# Patient Record
Sex: Female | Born: 1960
Health system: Southern US, Community
[De-identification: ages and names within clinical notes are randomized; demographics above are authoritative.]

## PROBLEM LIST (undated history)

## (undated) DIAGNOSIS — E663 Overweight: Secondary | ICD-10-CM

## (undated) DIAGNOSIS — E785 Hyperlipidemia, unspecified: Secondary | ICD-10-CM

## (undated) DIAGNOSIS — I10 Essential (primary) hypertension: Secondary | ICD-10-CM

## (undated) DIAGNOSIS — E6609 Other obesity due to excess calories: Secondary | ICD-10-CM

## (undated) DIAGNOSIS — R7303 Prediabetes: Secondary | ICD-10-CM

## (undated) DIAGNOSIS — E119 Type 2 diabetes mellitus without complications: Secondary | ICD-10-CM

## (undated) HISTORY — DX: Type 2 diabetes mellitus without complications: E11.9

## (undated) HISTORY — DX: Overweight: E66.3

## (undated) HISTORY — DX: Hyperlipidemia, unspecified: E78.5

## (undated) HISTORY — DX: Other obesity due to excess calories: E66.09

## (undated) HISTORY — DX: Essential (primary) hypertension: I10

## (undated) HISTORY — DX: Prediabetes: R73.03

## (undated) HISTORY — PX: ABDOMINAL HYSTERECTOMY: SHX81

---

## 2008-06-21 ENCOUNTER — Ambulatory Visit (HOSPITAL_COMMUNITY): Admission: RE | Admit: 2008-06-21 | Discharge: 2008-06-21 | Payer: Self-pay | Admitting: Obstetrics and Gynecology

## 2009-06-26 ENCOUNTER — Ambulatory Visit (HOSPITAL_COMMUNITY): Admission: RE | Admit: 2009-06-26 | Discharge: 2009-06-26 | Payer: Self-pay | Admitting: Obstetrics and Gynecology

## 2010-05-27 ENCOUNTER — Other Ambulatory Visit (HOSPITAL_COMMUNITY): Payer: Self-pay | Admitting: Obstetrics and Gynecology

## 2010-05-27 DIAGNOSIS — Z1239 Encounter for other screening for malignant neoplasm of breast: Secondary | ICD-10-CM

## 2010-05-27 DIAGNOSIS — Z1231 Encounter for screening mammogram for malignant neoplasm of breast: Secondary | ICD-10-CM

## 2010-07-03 ENCOUNTER — Ambulatory Visit (HOSPITAL_COMMUNITY): Payer: Self-pay

## 2010-07-10 ENCOUNTER — Ambulatory Visit (HOSPITAL_COMMUNITY)
Admission: RE | Admit: 2010-07-10 | Discharge: 2010-07-10 | Disposition: A | Discharge status: Home / Self Care | Payer: 59 | Source: Ambulatory Visit | Attending: Obstetrics and Gynecology | Admitting: Obstetrics and Gynecology

## 2010-07-10 DIAGNOSIS — Z1231 Encounter for screening mammogram for malignant neoplasm of breast: Secondary | ICD-10-CM | POA: Insufficient documentation

## 2011-08-20 ENCOUNTER — Other Ambulatory Visit (HOSPITAL_COMMUNITY): Payer: Self-pay | Admitting: Obstetrics and Gynecology

## 2011-08-20 DIAGNOSIS — Z1231 Encounter for screening mammogram for malignant neoplasm of breast: Secondary | ICD-10-CM

## 2011-09-19 ENCOUNTER — Ambulatory Visit (HOSPITAL_COMMUNITY)
Admission: RE | Admit: 2011-09-19 | Discharge: 2011-09-19 | Disposition: A | Discharge status: Home / Self Care | Payer: 59 | Source: Ambulatory Visit | Attending: Obstetrics and Gynecology | Admitting: Obstetrics and Gynecology

## 2011-09-19 DIAGNOSIS — Z1231 Encounter for screening mammogram for malignant neoplasm of breast: Secondary | ICD-10-CM | POA: Insufficient documentation

## 2012-08-19 ENCOUNTER — Other Ambulatory Visit (HOSPITAL_COMMUNITY): Payer: Self-pay | Admitting: Obstetrics and Gynecology

## 2012-08-19 DIAGNOSIS — Z1231 Encounter for screening mammogram for malignant neoplasm of breast: Secondary | ICD-10-CM

## 2012-09-16 ENCOUNTER — Other Ambulatory Visit: Payer: Self-pay | Admitting: Nurse Practitioner

## 2012-09-16 DIAGNOSIS — R1013 Epigastric pain: Secondary | ICD-10-CM

## 2012-09-21 ENCOUNTER — Other Ambulatory Visit: Payer: Self-pay | Admitting: Obstetrics and Gynecology

## 2012-09-21 ENCOUNTER — Ambulatory Visit (HOSPITAL_COMMUNITY)
Admission: RE | Admit: 2012-09-21 | Discharge: 2012-09-21 | Disposition: A | Discharge status: Home / Self Care | Payer: 59 | Source: Ambulatory Visit | Attending: Obstetrics and Gynecology | Admitting: Obstetrics and Gynecology

## 2012-09-21 DIAGNOSIS — R928 Other abnormal and inconclusive findings on diagnostic imaging of breast: Secondary | ICD-10-CM

## 2012-09-21 DIAGNOSIS — Z1231 Encounter for screening mammogram for malignant neoplasm of breast: Secondary | ICD-10-CM | POA: Insufficient documentation

## 2012-09-29 ENCOUNTER — Other Ambulatory Visit: Payer: 59

## 2012-10-01 ENCOUNTER — Ambulatory Visit
Admission: RE | Admit: 2012-10-01 | Discharge: 2012-10-01 | Disposition: A | Discharge status: Home / Self Care | Payer: 59 | Source: Ambulatory Visit | Attending: Obstetrics and Gynecology | Admitting: Obstetrics and Gynecology

## 2012-10-01 DIAGNOSIS — R928 Other abnormal and inconclusive findings on diagnostic imaging of breast: Secondary | ICD-10-CM

## 2013-09-13 ENCOUNTER — Other Ambulatory Visit (HOSPITAL_COMMUNITY): Payer: Self-pay | Admitting: Obstetrics and Gynecology

## 2013-09-13 DIAGNOSIS — Z1231 Encounter for screening mammogram for malignant neoplasm of breast: Secondary | ICD-10-CM

## 2013-09-26 ENCOUNTER — Ambulatory Visit (HOSPITAL_COMMUNITY)
Admission: RE | Admit: 2013-09-26 | Discharge: 2013-09-26 | Disposition: A | Discharge status: Home / Self Care | Payer: 59 | Source: Ambulatory Visit | Attending: Obstetrics and Gynecology | Admitting: Obstetrics and Gynecology

## 2013-09-26 DIAGNOSIS — Z1231 Encounter for screening mammogram for malignant neoplasm of breast: Secondary | ICD-10-CM | POA: Insufficient documentation

## 2014-09-08 ENCOUNTER — Other Ambulatory Visit (HOSPITAL_COMMUNITY): Payer: Self-pay | Admitting: Obstetrics and Gynecology

## 2014-09-08 DIAGNOSIS — Z1231 Encounter for screening mammogram for malignant neoplasm of breast: Secondary | ICD-10-CM

## 2014-09-28 ENCOUNTER — Ambulatory Visit (HOSPITAL_COMMUNITY)
Admission: RE | Admit: 2014-09-28 | Discharge: 2014-09-28 | Disposition: A | Discharge status: Home / Self Care | Payer: 59 | Source: Ambulatory Visit | Attending: Obstetrics and Gynecology | Admitting: Obstetrics and Gynecology

## 2014-09-28 ENCOUNTER — Ambulatory Visit (HOSPITAL_COMMUNITY): Payer: 59

## 2014-09-28 DIAGNOSIS — Z1231 Encounter for screening mammogram for malignant neoplasm of breast: Secondary | ICD-10-CM | POA: Insufficient documentation

## 2015-06-13 DIAGNOSIS — H524 Presbyopia: Secondary | ICD-10-CM | POA: Diagnosis not present

## 2015-06-13 DIAGNOSIS — H5213 Myopia, bilateral: Secondary | ICD-10-CM | POA: Diagnosis not present

## 2015-09-06 ENCOUNTER — Other Ambulatory Visit: Payer: Self-pay | Admitting: Obstetrics and Gynecology

## 2015-09-06 ENCOUNTER — Other Ambulatory Visit: Payer: Self-pay

## 2015-09-06 DIAGNOSIS — Z1231 Encounter for screening mammogram for malignant neoplasm of breast: Secondary | ICD-10-CM

## 2015-10-01 ENCOUNTER — Ambulatory Visit
Admission: RE | Admit: 2015-10-01 | Discharge: 2015-10-01 | Disposition: A | Discharge status: Home / Self Care | Payer: 59 | Source: Ambulatory Visit | Attending: Obstetrics and Gynecology | Admitting: Obstetrics and Gynecology

## 2015-10-01 DIAGNOSIS — Z1231 Encounter for screening mammogram for malignant neoplasm of breast: Secondary | ICD-10-CM | POA: Diagnosis not present

## 2016-02-01 DIAGNOSIS — Z01419 Encounter for gynecological examination (general) (routine) without abnormal findings: Secondary | ICD-10-CM | POA: Diagnosis not present

## 2016-02-01 DIAGNOSIS — Z6828 Body mass index (BMI) 28.0-28.9, adult: Secondary | ICD-10-CM | POA: Diagnosis not present

## 2016-02-01 DIAGNOSIS — Z1329 Encounter for screening for other suspected endocrine disorder: Secondary | ICD-10-CM | POA: Diagnosis not present

## 2016-02-01 DIAGNOSIS — Z130 Encounter for screening for diseases of the blood and blood-forming organs and certain disorders involving the immune mechanism: Secondary | ICD-10-CM | POA: Diagnosis not present

## 2016-02-01 DIAGNOSIS — Z1321 Encounter for screening for nutritional disorder: Secondary | ICD-10-CM | POA: Diagnosis not present

## 2016-02-01 DIAGNOSIS — Z0000 Encounter for general adult medical examination without abnormal findings: Secondary | ICD-10-CM | POA: Diagnosis not present

## 2016-02-01 DIAGNOSIS — Z13220 Encounter for screening for lipoid disorders: Secondary | ICD-10-CM | POA: Diagnosis not present

## 2016-02-01 DIAGNOSIS — Z131 Encounter for screening for diabetes mellitus: Secondary | ICD-10-CM | POA: Diagnosis not present

## 2016-07-17 DIAGNOSIS — H5213 Myopia, bilateral: Secondary | ICD-10-CM | POA: Diagnosis not present

## 2016-08-20 ENCOUNTER — Ambulatory Visit (HOSPITAL_COMMUNITY)
Admission: EM | Admit: 2016-08-20 | Discharge: 2016-08-20 | Disposition: A | Discharge status: Home / Self Care | Payer: 59 | Attending: Family Medicine | Admitting: Family Medicine

## 2016-08-20 ENCOUNTER — Encounter (HOSPITAL_COMMUNITY): Payer: Self-pay | Admitting: Family Medicine

## 2016-08-20 DIAGNOSIS — S39012A Strain of muscle, fascia and tendon of lower back, initial encounter: Secondary | ICD-10-CM

## 2016-08-20 MED ORDER — CYCLOBENZAPRINE HCL 10 MG PO TABS: 10.0000 mg | ORAL_TABLET | Freq: Every day | ORAL | 0 refills | Status: DC

## 2016-08-20 MED ORDER — NAPROXEN 500 MG PO TABS: 500.0000 mg | ORAL_TABLET | Freq: Two times a day (BID) | ORAL | 0 refills | Status: DC

## 2016-08-20 NOTE — ED Provider Notes (Signed)
CSN: [id number]     Arrival date & time 08/20/16  1108 History   None    Chief Complaint  Patient presents with  . Back Pain   (Consider location/radiation/quality/duration/timing/severity/associated sxs/prior Treatment) Patient c/o left lumbar muscle tenderness and back pain   The history is provided by the patient.  Back Pain  Location:  Lumbar spine Quality:  Aching Pain severity:  Moderate Pain is:  Worse during the day Onset quality:  Sudden Duration:  1 day Timing:  Constant Chronicity:  New Relieved by:  Nothing Worsened by:  Nothing Ineffective treatments:  None tried   History reviewed. No pertinent past medical history. Past Surgical History:  Procedure Laterality Date  . ABDOMINAL HYSTERECTOMY     History reviewed. No pertinent family history. Social History  Substance Use Topics  . Smoking status: Never Smoker  . Smokeless tobacco: Never Used  . Alcohol use Not on file   OB History    No data available     Review of Systems  Constitutional: Negative.   HENT: Negative.   Eyes: Negative.   Respiratory: Negative.   Cardiovascular: Negative.   Gastrointestinal: Negative.   Endocrine: Negative.   Genitourinary: Negative.   Musculoskeletal: Positive for back pain.  Allergic/Immunologic: Negative.   Neurological: Negative.   Hematological: Negative.   Psychiatric/Behavioral: Negative.     Allergies  Patient has no known allergies.  Home Medications   Prior to Admission medications   Medication Sig Start Date End Date Taking? Authorizing Provider  cyclobenzaprine (FLEXERIL) 10 MG tablet Take 1 tablet (10 mg total) by mouth at bedtime. 08/20/16   Lysbeth Penner, FNP  naproxen (NAPROSYN) 500 MG tablet Take 1 tablet (500 mg total) by mouth 2 (two) times daily with a meal. 08/20/16   Sora Vrooman, Orson Ape, FNP   Meds Ordered and Administered this Visit  Medications - No data to display  BP (!) 150/97   Pulse 77   Resp 18   SpO2 100%  No data  found.   Physical Exam  Constitutional: She is oriented to person, place, and time. She appears well-developed and well-nourished.  HENT:  Head: Normocephalic and atraumatic.  Eyes: Conjunctivae and EOM are normal. Pupils are equal, round, and reactive to light.  Cardiovascular: Normal rate and normal heart sounds.   Pulmonary/Chest: Effort normal and breath sounds normal.  Abdominal: Soft. Bowel sounds are normal.  Musculoskeletal: She exhibits tenderness.  TTP left lumbar spine paraspinous muscles  Neurological: She is alert and oriented to person, place, and time.  Nursing note and vitals reviewed.   Urgent Care Course     Procedures (including critical care time)  Labs Review Labs Reviewed - No data to display  Imaging Review No results found.   Visual Acuity Review  Right Eye Distance:   Left Eye Distance:   Bilateral Distance:    Right Eye Near:   Left Eye Near:    Bilateral Near:         MDM   1. Strain of lumbar region, initial encounter    Naprosyn 500mg  one po bid x 10 days #20 Cyclobenzaprine 10mg  one po qhs prn #10      Lysbeth Penner, FNP 08/20/16 1225

## 2016-08-20 NOTE — ED Triage Notes (Signed)
Pt here for left sided lower back pain x 1 week. sts the pain is constant. sts worse with sitting. Denies urinary symptoms.

## 2016-08-27 ENCOUNTER — Other Ambulatory Visit: Payer: Self-pay | Admitting: Obstetrics and Gynecology

## 2016-08-27 DIAGNOSIS — Z1231 Encounter for screening mammogram for malignant neoplasm of breast: Secondary | ICD-10-CM

## 2016-10-01 ENCOUNTER — Ambulatory Visit
Admission: RE | Admit: 2016-10-01 | Discharge: 2016-10-01 | Disposition: A | Discharge status: Home / Self Care | Payer: 59 | Source: Ambulatory Visit | Attending: Obstetrics and Gynecology | Admitting: Obstetrics and Gynecology

## 2016-10-01 DIAGNOSIS — Z1231 Encounter for screening mammogram for malignant neoplasm of breast: Secondary | ICD-10-CM | POA: Diagnosis not present

## 2017-02-04 DIAGNOSIS — Z01419 Encounter for gynecological examination (general) (routine) without abnormal findings: Secondary | ICD-10-CM | POA: Diagnosis not present

## 2017-02-04 DIAGNOSIS — Z6828 Body mass index (BMI) 28.0-28.9, adult: Secondary | ICD-10-CM | POA: Diagnosis not present

## 2017-02-04 DIAGNOSIS — Z833 Family history of diabetes mellitus: Secondary | ICD-10-CM | POA: Diagnosis not present

## 2017-02-04 DIAGNOSIS — R899 Unspecified abnormal finding in specimens from other organs, systems and tissues: Secondary | ICD-10-CM | POA: Diagnosis not present

## 2017-02-04 DIAGNOSIS — Z1151 Encounter for screening for human papillomavirus (HPV): Secondary | ICD-10-CM | POA: Diagnosis not present

## 2017-03-03 DIAGNOSIS — Z1211 Encounter for screening for malignant neoplasm of colon: Secondary | ICD-10-CM | POA: Diagnosis not present

## 2017-04-29 DIAGNOSIS — Z1211 Encounter for screening for malignant neoplasm of colon: Secondary | ICD-10-CM | POA: Diagnosis not present

## 2017-04-29 DIAGNOSIS — D123 Benign neoplasm of transverse colon: Secondary | ICD-10-CM | POA: Diagnosis not present

## 2017-04-29 DIAGNOSIS — K635 Polyp of colon: Secondary | ICD-10-CM | POA: Diagnosis not present

## 2017-04-29 DIAGNOSIS — K5140 Inflammatory polyps of colon without complications: Secondary | ICD-10-CM | POA: Diagnosis not present

## 2017-07-30 DIAGNOSIS — H524 Presbyopia: Secondary | ICD-10-CM | POA: Diagnosis not present

## 2017-07-30 DIAGNOSIS — H5213 Myopia, bilateral: Secondary | ICD-10-CM | POA: Diagnosis not present

## 2017-08-26 ENCOUNTER — Other Ambulatory Visit: Payer: Self-pay | Admitting: Obstetrics and Gynecology

## 2017-08-26 DIAGNOSIS — Z1231 Encounter for screening mammogram for malignant neoplasm of breast: Secondary | ICD-10-CM

## 2017-10-02 ENCOUNTER — Ambulatory Visit: Payer: 59

## 2017-10-28 ENCOUNTER — Ambulatory Visit
Admission: RE | Admit: 2017-10-28 | Discharge: 2017-10-28 | Disposition: A | Discharge status: Home / Self Care | Payer: 59 | Source: Ambulatory Visit | Attending: Obstetrics and Gynecology | Admitting: Obstetrics and Gynecology

## 2017-10-28 DIAGNOSIS — Z1231 Encounter for screening mammogram for malignant neoplasm of breast: Secondary | ICD-10-CM | POA: Diagnosis not present

## 2018-02-17 DIAGNOSIS — Z0000 Encounter for general adult medical examination without abnormal findings: Secondary | ICD-10-CM | POA: Diagnosis not present

## 2018-02-17 DIAGNOSIS — Z6828 Body mass index (BMI) 28.0-28.9, adult: Secondary | ICD-10-CM | POA: Diagnosis not present

## 2018-02-17 DIAGNOSIS — Z01419 Encounter for gynecological examination (general) (routine) without abnormal findings: Secondary | ICD-10-CM | POA: Diagnosis not present

## 2018-02-17 DIAGNOSIS — Z833 Family history of diabetes mellitus: Secondary | ICD-10-CM | POA: Diagnosis not present

## 2018-02-17 DIAGNOSIS — Z1329 Encounter for screening for other suspected endocrine disorder: Secondary | ICD-10-CM | POA: Diagnosis not present

## 2018-02-17 DIAGNOSIS — Z130 Encounter for screening for diseases of the blood and blood-forming organs and certain disorders involving the immune mechanism: Secondary | ICD-10-CM | POA: Diagnosis not present

## 2018-05-20 DIAGNOSIS — E663 Overweight: Secondary | ICD-10-CM | POA: Diagnosis not present

## 2018-05-20 DIAGNOSIS — R7303 Prediabetes: Secondary | ICD-10-CM | POA: Diagnosis not present

## 2018-05-20 DIAGNOSIS — R030 Elevated blood-pressure reading, without diagnosis of hypertension: Secondary | ICD-10-CM | POA: Diagnosis not present

## 2018-08-12 DIAGNOSIS — H524 Presbyopia: Secondary | ICD-10-CM | POA: Diagnosis not present

## 2018-08-12 DIAGNOSIS — H5213 Myopia, bilateral: Secondary | ICD-10-CM | POA: Diagnosis not present

## 2018-10-13 ENCOUNTER — Other Ambulatory Visit: Payer: Self-pay | Admitting: Obstetrics and Gynecology

## 2018-10-13 DIAGNOSIS — Z1231 Encounter for screening mammogram for malignant neoplasm of breast: Secondary | ICD-10-CM

## 2018-11-26 ENCOUNTER — Ambulatory Visit: Payer: 59

## 2018-12-08 ENCOUNTER — Other Ambulatory Visit: Payer: Self-pay

## 2018-12-08 ENCOUNTER — Ambulatory Visit
Admission: RE | Admit: 2018-12-08 | Discharge: 2018-12-08 | Disposition: A | Discharge status: Home / Self Care | Payer: 59 | Source: Ambulatory Visit | Attending: Obstetrics and Gynecology | Admitting: Obstetrics and Gynecology

## 2018-12-08 DIAGNOSIS — Z1231 Encounter for screening mammogram for malignant neoplasm of breast: Secondary | ICD-10-CM | POA: Diagnosis not present

## 2018-12-27 ENCOUNTER — Ambulatory Visit: Payer: 59

## 2019-03-14 DIAGNOSIS — Z6830 Body mass index (BMI) 30.0-30.9, adult: Secondary | ICD-10-CM | POA: Diagnosis not present

## 2019-03-14 DIAGNOSIS — Z01419 Encounter for gynecological examination (general) (routine) without abnormal findings: Secondary | ICD-10-CM | POA: Diagnosis not present

## 2019-05-18 DIAGNOSIS — I10 Essential (primary) hypertension: Secondary | ICD-10-CM | POA: Diagnosis not present

## 2019-05-18 DIAGNOSIS — Z6830 Body mass index (BMI) 30.0-30.9, adult: Secondary | ICD-10-CM | POA: Diagnosis not present

## 2019-05-18 DIAGNOSIS — E6609 Other obesity due to excess calories: Secondary | ICD-10-CM | POA: Diagnosis not present

## 2019-06-22 DIAGNOSIS — E785 Hyperlipidemia, unspecified: Secondary | ICD-10-CM | POA: Diagnosis not present

## 2019-06-22 DIAGNOSIS — Z0000 Encounter for general adult medical examination without abnormal findings: Secondary | ICD-10-CM | POA: Diagnosis not present

## 2019-06-22 DIAGNOSIS — R011 Cardiac murmur, unspecified: Secondary | ICD-10-CM | POA: Diagnosis not present

## 2019-06-22 DIAGNOSIS — I10 Essential (primary) hypertension: Secondary | ICD-10-CM | POA: Diagnosis not present

## 2019-06-22 DIAGNOSIS — R7303 Prediabetes: Secondary | ICD-10-CM | POA: Diagnosis not present

## 2019-06-22 DIAGNOSIS — Z1159 Encounter for screening for other viral diseases: Secondary | ICD-10-CM | POA: Diagnosis not present

## 2019-06-22 DIAGNOSIS — E6609 Other obesity due to excess calories: Secondary | ICD-10-CM | POA: Diagnosis not present

## 2019-06-22 MED FILL — AMLODIPINE BESYLATE 10 MG T: 10 | 90 days supply | Qty: 90 | Fill #0

## 2019-06-27 MED FILL — UNIFINE PENTIPS 31GX3/16: 31G X 5 MM | 90 days supply | Qty: 100 | Fill #0

## 2019-06-27 MED FILL — SAXENDA 18 MG/3 ML PEN: 18 | 30 days supply | Qty: 15 | Fill #0

## 2019-06-27 MED FILL — UNIFINE PENTIPS 31GX3/16": 31G X 5 MM | 90 days supply | Qty: 100 | Fill #0

## 2019-06-28 MED FILL — ROSUVASTATIN CALCIUM 5 MG T: 5 | 90 days supply | Qty: 90 | Fill #0

## 2019-07-06 DIAGNOSIS — R011 Cardiac murmur, unspecified: Secondary | ICD-10-CM | POA: Diagnosis not present

## 2019-08-01 ENCOUNTER — Other Ambulatory Visit: Payer: Self-pay

## 2019-08-01 ENCOUNTER — Encounter: Payer: Self-pay | Admitting: Interventional Cardiology

## 2019-08-01 ENCOUNTER — Ambulatory Visit: Payer: 59 | Admitting: Interventional Cardiology

## 2019-08-01 VITALS — BP 122/84 | HR 75 | Ht 63.0 in | Wt 167.2 lb

## 2019-08-01 DIAGNOSIS — R0789 Other chest pain: Secondary | ICD-10-CM

## 2019-08-01 DIAGNOSIS — I10 Essential (primary) hypertension: Secondary | ICD-10-CM

## 2019-08-01 DIAGNOSIS — R0602 Shortness of breath: Secondary | ICD-10-CM | POA: Diagnosis not present

## 2019-08-01 DIAGNOSIS — R5383 Other fatigue: Secondary | ICD-10-CM

## 2019-08-01 DIAGNOSIS — R7303 Prediabetes: Secondary | ICD-10-CM

## 2019-08-01 MED ORDER — METOPROLOL TARTRATE 50 MG PO TABS: ORAL_TABLET | ORAL | 0 refills | Status: AC

## 2019-08-01 NOTE — Patient Instructions (Addendum)
Medication Instructions:  Your physician recommends that you continue on your current medications as directed. Please refer to the Current Medication list given to you today.  *If you need a refill on your cardiac medications before your next appointment, please call your pharmacy*   Lab Work: None today  If you have labs (blood work) drawn today and your tests are completely normal, you will receive your results only by: Marland Kitchen MyChart Message (if you have MyChart) OR . A paper copy in the mail If you have any lab test that is abnormal or we need to change your treatment, we will call you to review the results.   Testing/Procedures: Your physician has requested that you have cardiac CT. Cardiac computed tomography (CT) is a painless test that uses an x-ray machine to take clear, detailed pictures of your heart. For further information please visit [email or web address]. Please follow instruction sheet as given.   Follow-Up: Based on test results  Other Instructions Your cardiac CT will be scheduled at one of the below locations:   [facility] [address] [place], [place] [address] [phone]  [facility] [address] [place], [place] [address] [phone]  If scheduled at [facility], please arrive at the [facility] main entrance of [facility] 30 minutes prior to test start time. Proceed to the [facility] Radiology Department (first floor) to check-in and test prep.  If scheduled at [facility], please arrive 15 mins early for check-in and test prep.  Please follow these instructions carefully (unless otherwise directed):    On the Night Before the Test: . Be sure to Drink plenty of water. . Do not consume any caffeinated/decaffeinated beverages or chocolate 12 hours prior to your test. . Do not take any antihistamines 12 hours prior to your test.   On the  Day of the Test: . Drink plenty of water. Do not drink any water within one hour of the test. . Do not eat any food 4 hours prior to the test. . You may take your regular medications prior to the test.  . Take metoprolol (Lopressor) 50 MG two hours prior to test. . HOLD Saxenda the morning of the test . FEMALES- please wear underwire-free bra if available       After the Test: . Drink plenty of water. . After receiving IV contrast, you may experience a mild flushed feeling. This is normal. . On occasion, you may experience a mild rash up to 24 hours after the test. This is not dangerous. If this occurs, you can take Benadryl 25 mg and increase your fluid intake. . If you experience trouble breathing, this can be serious. If it is severe call 911 IMMEDIATELY. If it is mild, please call our office.    Once we have confirmed authorization from your insurance company, we will call you to set up a date and time for your test.   For non-scheduling related questions, please contact the cardiac imaging nurse navigator should you have any questions/concerns: Marchia Bond, RN Navigator Cardiac Imaging [facility] [phone] office  For scheduling needs, including cancellations and rescheduling, please call [phone].

## 2019-08-01 NOTE — Progress Notes (Addendum)
Cardiology Office Note   Date:  08/01/2019   ID:  Latasha Cain, DOB [birth year], MRN [id number]  PCP:  Patient, No Pcp Per    No chief complaint on file.  Abnormal echo  Wt Readings from Last 3 Encounters:  08/01/19 167 lb 3.2 oz (75.8 kg)       History of Present Illness: Latasha Cain is a 59 y.o. female who is being seen today for the evaluation of fatigue at the request of Lois Huxley, [place].  She had noticed fatigue for several months when she saw her PMD at the end of March.  Echo was ordered for murmur and BP meds were started. BP was checked at [facility] and was high there.  She works as a Animal nutritionist as well. BP was in the 160s.  Amlodipine 10 mg daily was started.  BP now in the 120-130s range. Fatigue persists but is better.   Denies :  Dizziness. Leg edema. Nitroglycerin use. Orthopnea. Palpitations. Paroxysmal nocturnal dyspnea. Shortness of breath. Syncope.   Echo showed diastolic dysfunction.  Report mentions normal LV function.  No valvular disease was commented upon in the report from the primary care doctor which is what I have currently.  BP has improved with amlodipine.  She is trying to improve her diet.  She has had some chest tightness , sometimes with exertion.  She is also noted dyspnea on exertion while walking upstairs.    Past Medical History:  Diagnosis Date  . DM (diabetes mellitus) ([place])   . Hyperlipidemia   . Hypertension   . Obesity due to excess calories   . Overweight   . Prediabetes     Past Surgical History:  Procedure Laterality Date  . ABDOMINAL HYSTERECTOMY       Current Outpatient Medications  Medication Sig Dispense Refill  . AMLODIPINE BESYLATE PO Take 10 mg by mouth.    . Insulin Pen Needle (NOVOFINE) 32G X 6 MM MISC by Does not apply route.    . Liraglutide -Weight Management (SAXENDA) 18 MG/3ML SOPN Inject into the skin.    . rosuvastatin (CRESTOR) 5 MG tablet Take 5 mg by mouth daily.     No current  facility-administered medications for this visit.    Allergies:   Patient has no known allergies.    Social History:  The patient  reports that she has never smoked. She has never used smokeless tobacco.   Family History:  The patient's family history includes Breast cancer in her maternal grandmother.    ROS:  Please see the history of present illness.   Otherwise, review of systems are positive for fatigue.   All other systems are reviewed and negative.    PHYSICAL EXAM: VS:  BP 122/84   Pulse 75   Ht 5\' 3"  (1.6 m)   Wt 167 lb 3.2 oz (75.8 kg)   SpO2 99%   BMI 29.62 kg/m  , BMI Body mass index is 29.62 kg/m. GEN: Well nourished, well developed, in no acute distress  HEENT: normal  Neck: no JVD, carotid bruits, or masses Cardiac: RRR; 1/6 early systolic murmur, no rubs, or gallops,no edema  Respiratory:  clear to auscultation bilaterally, normal work of breathing GI: soft, nontender, nondistended, + BS MS: no deformity or atrophy  Skin: warm and dry, no rash Neuro:  Strength and sensation are intact Psych: euthymic mood, full affect   EKG:   The ekg ordered today demonstrates NSR, no ST segment changes  Recent Labs: No results found for requested labs within last 8760 hours.   Lipid Panel No results found for: CHOL, TRIG, HDL, CHOLHDL, VLDL, LDLCALC, LDLDIRECT   Other studies Reviewed: Additional studies/ records that were reviewed today with results demonstrating: Echo/lab results from [facility] reviewed..   ASSESSMENT AND PLAN:  1. HTN: Controlled on amlodipine.  She is trying to improve her diet.  Encouraged low-salt diet as well. 2. Prediabetes: Whole food, plant-based diet recommended.  She is also taking medication for weight loss.  This should help her A1c. 3. Fatigue/chest tightness/DOE: Given risk factors for CAD and some symptoms with exertion, will plan for coronary CTA.  Okay to give metoprolol 50 mg prior to the CT scan.  LDL elevated at 157 .  A1C 6.5  in 3/21.   Current medicines are reviewed at length with the patient today.  The patient concerns regarding her medicines were addressed.  The following changes have been made:  No change  Labs/ tests ordered today include:  No orders of the defined types were placed in this encounter.   Recommend 150 minutes/week of aerobic exercise Low fat, low carb, high fiber diet recommended  Disposition:   FU for CT scan   Signed, Larae Grooms, MD  08/01/2019 4:15 PM    [facility] [place], [place], [place]  [address] Phone: [phone]; Fax: [phone]

## 2019-08-08 DIAGNOSIS — H5213 Myopia, bilateral: Secondary | ICD-10-CM | POA: Diagnosis not present

## 2019-08-08 DIAGNOSIS — H524 Presbyopia: Secondary | ICD-10-CM | POA: Diagnosis not present

## 2019-08-09 MED FILL — SAXENDA 18 MG/3 ML PEN: 18 | 30 days supply | Qty: 15 | Fill #1

## 2019-08-16 ENCOUNTER — Telehealth (HOSPITAL_COMMUNITY): Payer: Self-pay | Admitting: Emergency Medicine

## 2019-08-16 NOTE — Telephone Encounter (Signed)
Calling patient to review CCTA instructions; pt states she called yesterday to cancel test  Will notify Romie Minus, patient does not wish to r/s at this time .  Marchia Bond RN Navigator Cardiac Imaging [facility] [phone] Office  [phone] Cell

## 2019-08-17 ENCOUNTER — Ambulatory Visit (HOSPITAL_COMMUNITY): Payer: 59

## 2019-09-26 MED FILL — AMLODIPINE BESYLATE 10 MG T: 10 | 90 days supply | Qty: 90 | Fill #1

## 2019-09-26 MED FILL — ROSUVASTATIN CALCIUM 5 MG T: 5 | 90 days supply | Qty: 90 | Fill #1

## 2019-10-20 ENCOUNTER — Other Ambulatory Visit (HOSPITAL_COMMUNITY): Payer: Self-pay | Admitting: Family Medicine

## 2019-10-20 DIAGNOSIS — Z23 Encounter for immunization: Secondary | ICD-10-CM | POA: Diagnosis not present

## 2019-10-20 DIAGNOSIS — E785 Hyperlipidemia, unspecified: Secondary | ICD-10-CM | POA: Diagnosis not present

## 2019-10-20 DIAGNOSIS — I10 Essential (primary) hypertension: Secondary | ICD-10-CM | POA: Diagnosis not present

## 2019-10-20 DIAGNOSIS — E1169 Type 2 diabetes mellitus with other specified complication: Secondary | ICD-10-CM | POA: Diagnosis not present

## 2019-10-20 DIAGNOSIS — E6609 Other obesity due to excess calories: Secondary | ICD-10-CM | POA: Diagnosis not present

## 2019-10-20 MED FILL — SAXENDA 18 MG/3 ML PEN: 18 | 30 days supply | Qty: 15 | Fill #0

## 2019-10-26 MED FILL — FREESTYLE LANCETS: 90 days supply | Qty: 100 | Fill #0

## 2019-10-26 MED FILL — FREESTYLE LITE TEST STRIP: 50 days supply | Qty: 50 | Fill #0

## 2019-10-26 MED FILL — FREESTYLE FREEDOM LITE METE: W/DEVICE | 1 days supply | Qty: 1 | Fill #0

## 2019-11-10 ENCOUNTER — Other Ambulatory Visit: Payer: Self-pay | Admitting: Obstetrics and Gynecology

## 2019-11-10 DIAGNOSIS — Z1231 Encounter for screening mammogram for malignant neoplasm of breast: Secondary | ICD-10-CM

## 2019-12-02 MED FILL — SAXENDA 18 MG/3 ML PEN: 18 | 30 days supply | Qty: 15 | Fill #1

## 2019-12-13 ENCOUNTER — Ambulatory Visit: Payer: 59

## 2019-12-15 ENCOUNTER — Other Ambulatory Visit: Payer: Self-pay

## 2019-12-15 ENCOUNTER — Ambulatory Visit
Admission: RE | Admit: 2019-12-15 | Discharge: 2019-12-15 | Disposition: A | Discharge status: Home / Self Care | Payer: 59 | Source: Ambulatory Visit | Attending: Obstetrics and Gynecology | Admitting: Obstetrics and Gynecology

## 2019-12-15 DIAGNOSIS — R42 Dizziness and giddiness: Secondary | ICD-10-CM | POA: Diagnosis not present

## 2019-12-15 DIAGNOSIS — E663 Overweight: Secondary | ICD-10-CM | POA: Diagnosis not present

## 2019-12-15 DIAGNOSIS — Z1231 Encounter for screening mammogram for malignant neoplasm of breast: Secondary | ICD-10-CM | POA: Diagnosis not present

## 2019-12-28 MED FILL — ROSUVASTATIN CALCIUM 5 MG T: 5 | 90 days supply | Qty: 90 | Fill #0

## 2019-12-28 MED FILL — AMLODIPINE BESYLATE 10 MG T: 10 | 90 days supply | Qty: 90 | Fill #0

## 2020-01-06 MED FILL — SAXENDA 18 MG/3 ML PEN: 18 | 30 days supply | Qty: 15 | Fill #2

## 2020-03-06 ENCOUNTER — Other Ambulatory Visit (HOSPITAL_COMMUNITY): Payer: Self-pay | Admitting: Family Medicine

## 2020-03-07 MED FILL — SAXENDA 18 MG/3 ML PEN: 18 | 30 days supply | Qty: 15 | Fill #0

## 2020-04-02 MED FILL — ROSUVASTATIN CALCIUM 5 MG T: 5 | 90 days supply | Qty: 90 | Fill #1

## 2020-04-02 MED FILL — AMLODIPINE BESYLATE 10 MG T: 10 | 90 days supply | Qty: 90 | Fill #1

## 2020-04-25 MED FILL — SAXENDA 18 MG/3 ML PEN: 18 | 30 days supply | Qty: 15 | Fill #1

## 2020-06-07 MED FILL — SAXENDA 18 MG/3 ML PEN: 18 | 30 days supply | Qty: 15 | Fill #2

## 2020-07-04 ENCOUNTER — Other Ambulatory Visit (HOSPITAL_COMMUNITY): Payer: Self-pay | Admitting: Family Medicine

## 2020-07-04 MED FILL — ROSUVASTATIN CALCIUM 5 MG T: 5 | 90 days supply | Qty: 90 | Fill #0

## 2020-07-04 MED FILL — AMLODIPINE BESYLATE 10 MG T: 10 | 90 days supply | Qty: 90 | Fill #0

## 2020-09-21 DIAGNOSIS — H524 Presbyopia: Secondary | ICD-10-CM | POA: Diagnosis not present

## 2020-09-21 DIAGNOSIS — H5213 Myopia, bilateral: Secondary | ICD-10-CM | POA: Diagnosis not present

## 2020-09-21 DIAGNOSIS — H52223 Regular astigmatism, bilateral: Secondary | ICD-10-CM | POA: Diagnosis not present

## 2020-10-02 ENCOUNTER — Other Ambulatory Visit (HOSPITAL_COMMUNITY): Payer: Self-pay

## 2020-10-02 MED ORDER — AMLODIPINE BESYLATE 10 MG PO TABS
10.0000 mg | ORAL_TABLET | Freq: Every day | ORAL | 1 refills | Status: AC
  Filled 2020-10-02: qty 90, 90d supply, fill #0
  Filled 2021-01-07: qty 90, 90d supply, fill #1

## 2020-10-03 ENCOUNTER — Other Ambulatory Visit (HOSPITAL_COMMUNITY): Payer: Self-pay

## 2020-10-04 ENCOUNTER — Other Ambulatory Visit (HOSPITAL_COMMUNITY): Payer: Self-pay

## 2020-10-04 MED ORDER — ROSUVASTATIN CALCIUM 5 MG PO TABS
5.0000 mg | ORAL_TABLET | Freq: Every day | ORAL | 1 refills | Status: AC
  Filled 2020-10-04: qty 90, 90d supply, fill #0
  Filled 2021-01-07: qty 90, 90d supply, fill #1

## 2020-10-04 MED ORDER — SAXENDA 18 MG/3ML ~~LOC~~ SOPN
3.0000 mg | PEN_INJECTOR | Freq: Every day | SUBCUTANEOUS | 2 refills | Status: AC
  Filled 2020-10-04: qty 15, 30d supply, fill #0
  Filled 2020-11-28: qty 15, 30d supply, fill #1
  Filled 2021-01-07: qty 15, 30d supply, fill #2

## 2020-10-09 ENCOUNTER — Other Ambulatory Visit (HOSPITAL_COMMUNITY): Payer: Self-pay

## 2020-11-08 ENCOUNTER — Other Ambulatory Visit: Payer: Self-pay | Admitting: Family Medicine

## 2020-11-08 DIAGNOSIS — Z1231 Encounter for screening mammogram for malignant neoplasm of breast: Secondary | ICD-10-CM

## 2020-11-28 ENCOUNTER — Other Ambulatory Visit (HOSPITAL_COMMUNITY): Payer: Self-pay

## 2020-12-28 ENCOUNTER — Ambulatory Visit
Admission: RE | Admit: 2020-12-28 | Discharge: 2020-12-28 | Disposition: A | Discharge status: Home / Self Care | Payer: 59 | Source: Ambulatory Visit | Attending: Family Medicine | Admitting: Family Medicine

## 2020-12-28 ENCOUNTER — Other Ambulatory Visit: Payer: Self-pay

## 2020-12-28 DIAGNOSIS — Z1231 Encounter for screening mammogram for malignant neoplasm of breast: Secondary | ICD-10-CM

## 2021-01-04 ENCOUNTER — Other Ambulatory Visit: Payer: Self-pay | Admitting: Family Medicine

## 2021-01-04 DIAGNOSIS — R928 Other abnormal and inconclusive findings on diagnostic imaging of breast: Secondary | ICD-10-CM

## 2021-01-07 ENCOUNTER — Other Ambulatory Visit (HOSPITAL_COMMUNITY): Payer: Self-pay

## 2021-01-08 ENCOUNTER — Other Ambulatory Visit: Payer: Self-pay | Admitting: Obstetrics and Gynecology

## 2021-01-08 DIAGNOSIS — R928 Other abnormal and inconclusive findings on diagnostic imaging of breast: Secondary | ICD-10-CM

## 2021-01-09 ENCOUNTER — Other Ambulatory Visit: Payer: Self-pay

## 2021-01-09 ENCOUNTER — Ambulatory Visit: Payer: 59

## 2021-01-09 ENCOUNTER — Ambulatory Visit
Admission: RE | Admit: 2021-01-09 | Discharge: 2021-01-09 | Disposition: A | Discharge status: Home / Self Care | Payer: 59 | Source: Ambulatory Visit | Attending: Family Medicine | Admitting: Family Medicine

## 2021-01-09 DIAGNOSIS — R922 Inconclusive mammogram: Secondary | ICD-10-CM | POA: Diagnosis not present

## 2021-01-09 DIAGNOSIS — R928 Other abnormal and inconclusive findings on diagnostic imaging of breast: Secondary | ICD-10-CM

## 2021-04-18 ENCOUNTER — Other Ambulatory Visit (HOSPITAL_COMMUNITY): Payer: Self-pay

## 2021-04-18 DIAGNOSIS — E785 Hyperlipidemia, unspecified: Secondary | ICD-10-CM | POA: Diagnosis not present

## 2021-04-18 DIAGNOSIS — I10 Essential (primary) hypertension: Secondary | ICD-10-CM | POA: Diagnosis not present

## 2021-04-18 DIAGNOSIS — Z0000 Encounter for general adult medical examination without abnormal findings: Secondary | ICD-10-CM | POA: Diagnosis not present

## 2021-04-18 DIAGNOSIS — E663 Overweight: Secondary | ICD-10-CM | POA: Diagnosis not present

## 2021-04-18 DIAGNOSIS — E1169 Type 2 diabetes mellitus with other specified complication: Secondary | ICD-10-CM | POA: Diagnosis not present

## 2021-04-18 DIAGNOSIS — Z23 Encounter for immunization: Secondary | ICD-10-CM | POA: Diagnosis not present

## 2021-04-18 MED ORDER — SAXENDA 18 MG/3ML ~~LOC~~ SOPN
3.0000 mg | PEN_INJECTOR | Freq: Every day | SUBCUTANEOUS | 5 refills | Status: AC
  Filled 2021-04-18: qty 15, 30d supply, fill #0
  Filled 2021-06-10: qty 15, 30d supply, fill #1
  Filled 2021-07-17: qty 15, 30d supply, fill #2
  Filled 2021-08-27: qty 15, 30d supply, fill #3
  Filled 2021-11-11 – 2022-02-15 (×4): qty 15, 30d supply, fill #4

## 2021-04-18 MED ORDER — ROSUVASTATIN CALCIUM 5 MG PO TABS
5.0000 mg | ORAL_TABLET | Freq: Every day | ORAL | 3 refills | Status: DC
  Filled 2021-04-18 – 2021-05-13 (×3): qty 90, 90d supply, fill #0
  Filled 2021-08-15: qty 90, 90d supply, fill #1
  Filled 2021-11-11: qty 90, 90d supply, fill #2
  Filled 2022-01-08 – 2022-02-15 (×2): qty 90, 90d supply, fill #3

## 2021-04-18 MED ORDER — AMLODIPINE BESYLATE 10 MG PO TABS
10.0000 mg | ORAL_TABLET | Freq: Every day | ORAL | 3 refills | Status: DC
  Filled 2021-04-18 – 2021-05-13 (×3): qty 90, 90d supply, fill #0
  Filled 2021-08-15: qty 90, 90d supply, fill #1
  Filled 2021-11-11: qty 90, 90d supply, fill #2
  Filled 2022-01-08 – 2022-02-15 (×2): qty 90, 90d supply, fill #3

## 2021-04-19 ENCOUNTER — Other Ambulatory Visit (HOSPITAL_COMMUNITY): Payer: Self-pay

## 2021-04-23 DIAGNOSIS — E875 Hyperkalemia: Secondary | ICD-10-CM | POA: Diagnosis not present

## 2021-05-13 ENCOUNTER — Other Ambulatory Visit (HOSPITAL_COMMUNITY): Payer: Self-pay

## 2021-06-10 ENCOUNTER — Other Ambulatory Visit (HOSPITAL_COMMUNITY): Payer: Self-pay

## 2021-07-10 ENCOUNTER — Other Ambulatory Visit (HOSPITAL_COMMUNITY): Payer: Self-pay

## 2021-07-10 MED ORDER — IBUPROFEN 800 MG PO TABS
800.0000 mg | ORAL_TABLET | Freq: Three times a day (TID) | ORAL | 0 refills | Status: AC
  Filled 2021-07-10: qty 24, 8d supply, fill #0

## 2021-07-17 ENCOUNTER — Other Ambulatory Visit (HOSPITAL_COMMUNITY): Payer: Self-pay

## 2021-08-15 ENCOUNTER — Other Ambulatory Visit (HOSPITAL_COMMUNITY): Payer: Self-pay

## 2021-08-27 ENCOUNTER — Other Ambulatory Visit (HOSPITAL_COMMUNITY): Payer: Self-pay

## 2021-08-30 ENCOUNTER — Other Ambulatory Visit (HOSPITAL_COMMUNITY): Payer: Self-pay

## 2021-10-03 DIAGNOSIS — H5213 Myopia, bilateral: Secondary | ICD-10-CM | POA: Diagnosis not present

## 2021-11-11 ENCOUNTER — Other Ambulatory Visit (HOSPITAL_COMMUNITY): Payer: Self-pay

## 2021-11-21 ENCOUNTER — Other Ambulatory Visit (HOSPITAL_COMMUNITY): Payer: Self-pay

## 2021-11-22 ENCOUNTER — Other Ambulatory Visit (HOSPITAL_COMMUNITY): Payer: Self-pay

## 2021-11-22 MED ORDER — SAXENDA 18 MG/3ML ~~LOC~~ SOPN
3.0000 mg | PEN_INJECTOR | Freq: Every day | SUBCUTANEOUS | 0 refills | Status: AC
  Filled 2021-11-22 – 2022-05-26 (×3): qty 15, 30d supply, fill #0

## 2021-12-13 ENCOUNTER — Other Ambulatory Visit: Payer: Self-pay | Admitting: Family Medicine

## 2021-12-13 DIAGNOSIS — Z1231 Encounter for screening mammogram for malignant neoplasm of breast: Secondary | ICD-10-CM

## 2022-01-08 ENCOUNTER — Other Ambulatory Visit (HOSPITAL_COMMUNITY): Payer: Self-pay

## 2022-01-14 ENCOUNTER — Ambulatory Visit
Admission: RE | Admit: 2022-01-14 | Discharge: 2022-01-14 | Disposition: A | Discharge status: Home / Self Care | Payer: 59 | Source: Ambulatory Visit | Attending: Family Medicine | Admitting: Family Medicine

## 2022-01-14 DIAGNOSIS — Z1231 Encounter for screening mammogram for malignant neoplasm of breast: Secondary | ICD-10-CM | POA: Diagnosis not present

## 2022-02-17 ENCOUNTER — Other Ambulatory Visit (HOSPITAL_COMMUNITY): Payer: Self-pay

## 2022-02-21 ENCOUNTER — Other Ambulatory Visit (HOSPITAL_COMMUNITY): Payer: Self-pay

## 2022-02-25 ENCOUNTER — Other Ambulatory Visit (HOSPITAL_COMMUNITY): Payer: Self-pay

## 2022-02-26 ENCOUNTER — Other Ambulatory Visit (HOSPITAL_COMMUNITY): Payer: Self-pay

## 2022-02-28 ENCOUNTER — Other Ambulatory Visit (HOSPITAL_COMMUNITY): Payer: Self-pay

## 2022-04-09 ENCOUNTER — Other Ambulatory Visit (HOSPITAL_COMMUNITY): Payer: Self-pay

## 2022-05-02 DIAGNOSIS — E663 Overweight: Secondary | ICD-10-CM | POA: Diagnosis not present

## 2022-05-02 DIAGNOSIS — E785 Hyperlipidemia, unspecified: Secondary | ICD-10-CM | POA: Diagnosis not present

## 2022-05-02 DIAGNOSIS — E1169 Type 2 diabetes mellitus with other specified complication: Secondary | ICD-10-CM | POA: Diagnosis not present

## 2022-05-02 DIAGNOSIS — I10 Essential (primary) hypertension: Secondary | ICD-10-CM | POA: Diagnosis not present

## 2022-05-02 DIAGNOSIS — Z0000 Encounter for general adult medical examination without abnormal findings: Secondary | ICD-10-CM | POA: Diagnosis not present

## 2022-05-18 ENCOUNTER — Other Ambulatory Visit (HOSPITAL_COMMUNITY): Payer: Self-pay

## 2022-05-21 ENCOUNTER — Other Ambulatory Visit (HOSPITAL_COMMUNITY): Payer: Self-pay

## 2022-05-21 MED ORDER — ROSUVASTATIN CALCIUM 5 MG PO TABS
5.0000 mg | ORAL_TABLET | Freq: Every day | ORAL | 3 refills | Status: DC
  Filled 2022-05-21: qty 90, 90d supply, fill #0
  Filled 2022-08-25: qty 90, 90d supply, fill #1
  Filled 2022-11-20: qty 90, 90d supply, fill #2
  Filled 2023-03-11: qty 90, 90d supply, fill #3

## 2022-05-21 MED ORDER — AMLODIPINE BESYLATE 10 MG PO TABS
10.0000 mg | ORAL_TABLET | Freq: Every day | ORAL | 1 refills | Status: DC
  Filled 2022-05-21: qty 90, 90d supply, fill #0
  Filled 2022-08-25: qty 90, 90d supply, fill #1

## 2022-05-26 ENCOUNTER — Other Ambulatory Visit (HOSPITAL_COMMUNITY): Payer: Self-pay

## 2022-07-04 ENCOUNTER — Other Ambulatory Visit (HOSPITAL_COMMUNITY): Payer: Self-pay

## 2022-07-09 ENCOUNTER — Other Ambulatory Visit (HOSPITAL_COMMUNITY): Payer: Self-pay

## 2022-07-14 ENCOUNTER — Other Ambulatory Visit (HOSPITAL_COMMUNITY): Payer: Self-pay

## 2022-07-15 ENCOUNTER — Other Ambulatory Visit (HOSPITAL_COMMUNITY): Payer: Self-pay

## 2022-07-15 MED ORDER — SAXENDA 18 MG/3ML ~~LOC~~ SOPN
3.0000 mg | PEN_INJECTOR | Freq: Every day | SUBCUTANEOUS | 0 refills | Status: AC
  Filled 2022-07-15: qty 15, 5d supply, fill #0
  Filled 2022-07-16: qty 3, 6d supply, fill #0
  Filled 2022-07-18 – 2022-08-25 (×3): qty 15, 30d supply, fill #0

## 2022-07-16 ENCOUNTER — Other Ambulatory Visit (HOSPITAL_COMMUNITY): Payer: Self-pay

## 2022-07-18 ENCOUNTER — Other Ambulatory Visit: Payer: Self-pay

## 2022-08-25 ENCOUNTER — Other Ambulatory Visit (HOSPITAL_COMMUNITY): Payer: Self-pay

## 2022-11-20 ENCOUNTER — Other Ambulatory Visit (HOSPITAL_COMMUNITY): Payer: Self-pay

## 2022-11-20 ENCOUNTER — Other Ambulatory Visit: Payer: Self-pay

## 2022-11-20 MED ORDER — AMLODIPINE BESYLATE 10 MG PO TABS
10.0000 mg | ORAL_TABLET | Freq: Every day | ORAL | 1 refills | Status: DC
  Filled 2022-11-20: qty 90, 90d supply, fill #0
  Filled 2023-03-11: qty 90, 90d supply, fill #1

## 2022-11-24 ENCOUNTER — Other Ambulatory Visit (HOSPITAL_COMMUNITY): Payer: Self-pay

## 2023-03-11 ENCOUNTER — Other Ambulatory Visit (HOSPITAL_COMMUNITY): Payer: Self-pay

## 2023-04-13 IMAGING — MG MM DIGITAL DIAGNOSTIC UNILAT*L* W/ TOMO W/ CAD
8 series · 9 of 24 positions shown · non-contrast
Comparison: Previous exam(s).

CLINICAL DATA: 60-year-old female presenting as a recall from
screening for possible left breast asymmetry.

EXAM:
DIGITAL DIAGNOSTIC UNILATERAL LEFT MAMMOGRAM WITH TOMOSYNTHESIS AND
CAD
TECHNIQUE: Left digital diagnostic mammography and breast tomosynthesis was
performed. The images were evaluated with computer-aided detection.

[L ML synth-2D]
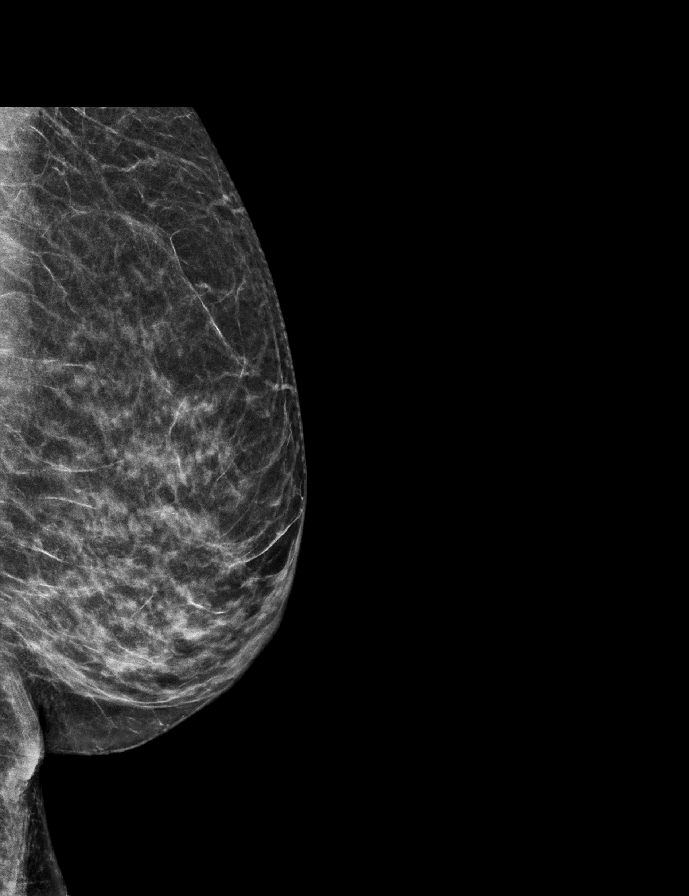

[L CC synth-2D (1 of 3)]
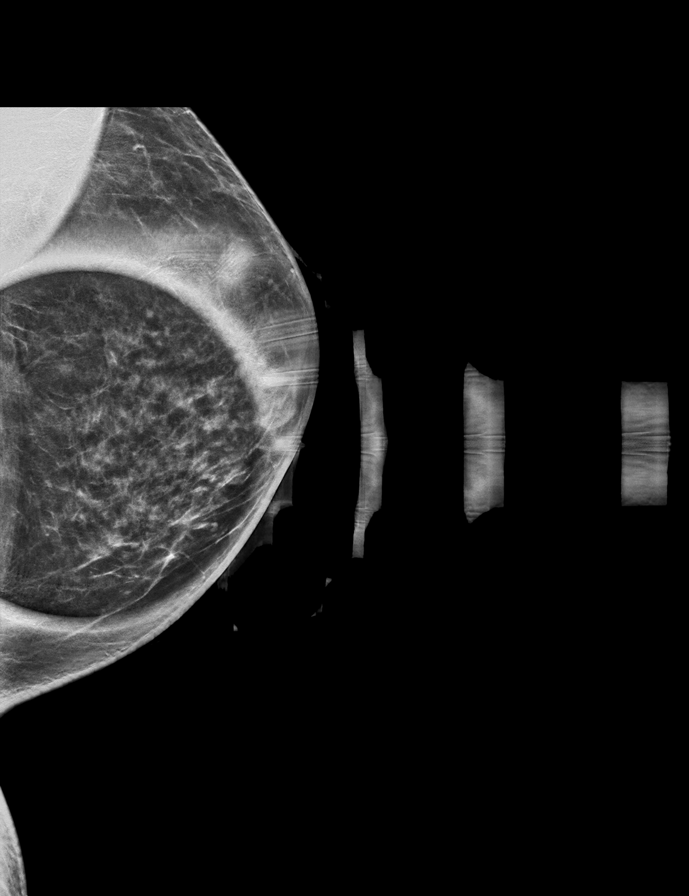

[L CC synth-2D (2 of 3)]
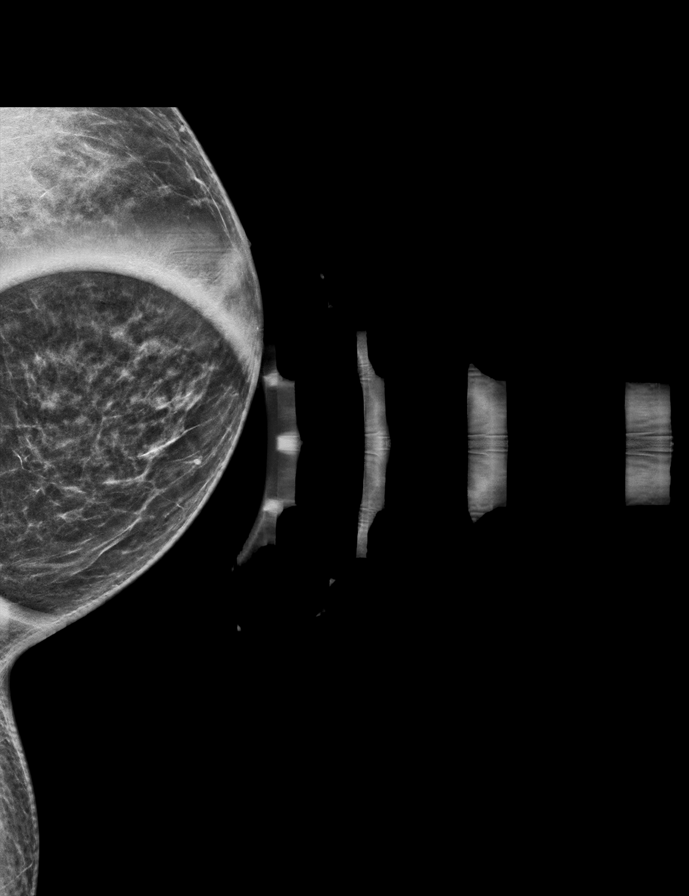

[L CC synth-2D (3 of 3)]
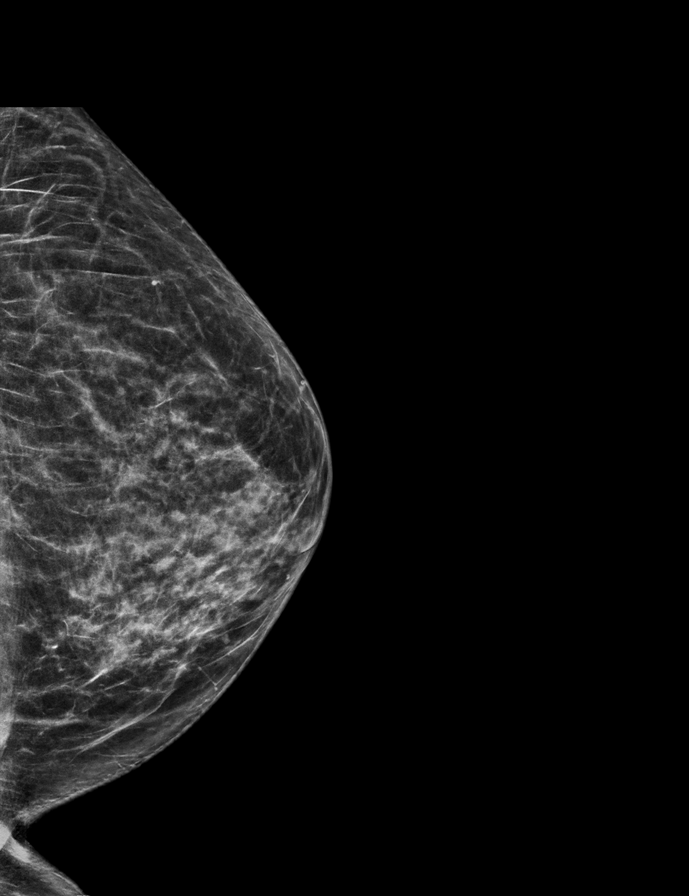

[L CC tomo · 2 of 53 frames shown (1 of 3)]
[frame 18/53]
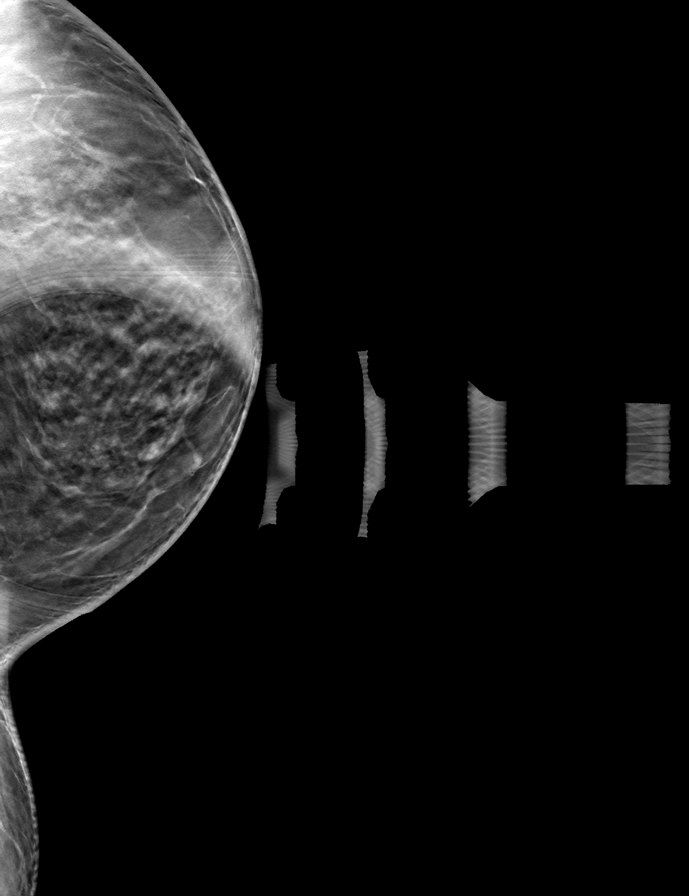
[frame 27/53]
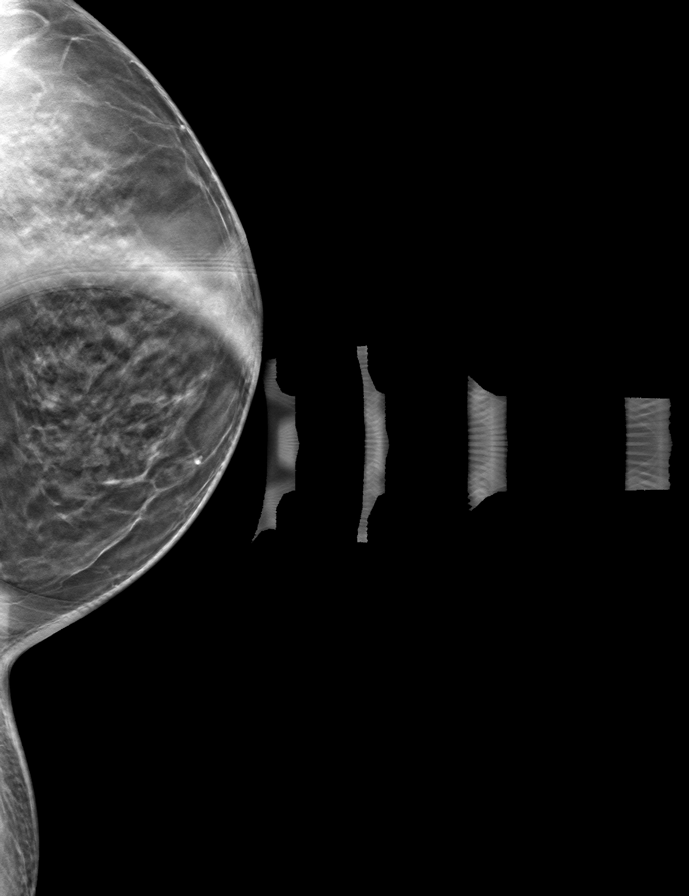

[L CC tomo (2 of 3) · tomo slice 27/53.0]
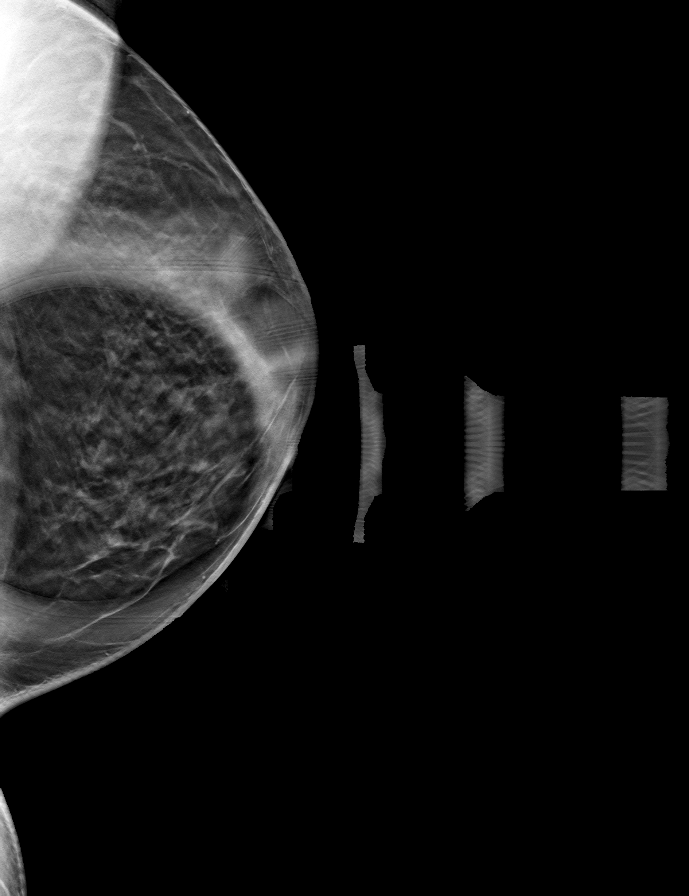

[L CC tomo (3 of 3) · tomo slice 27/52.0]
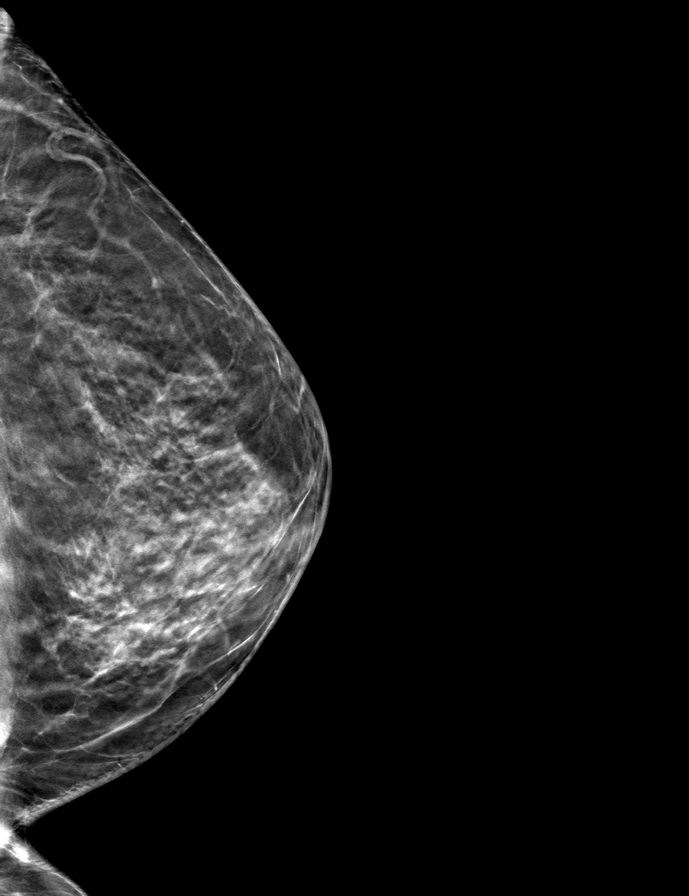

[L ML tomo · tomo slice 26/51.0]
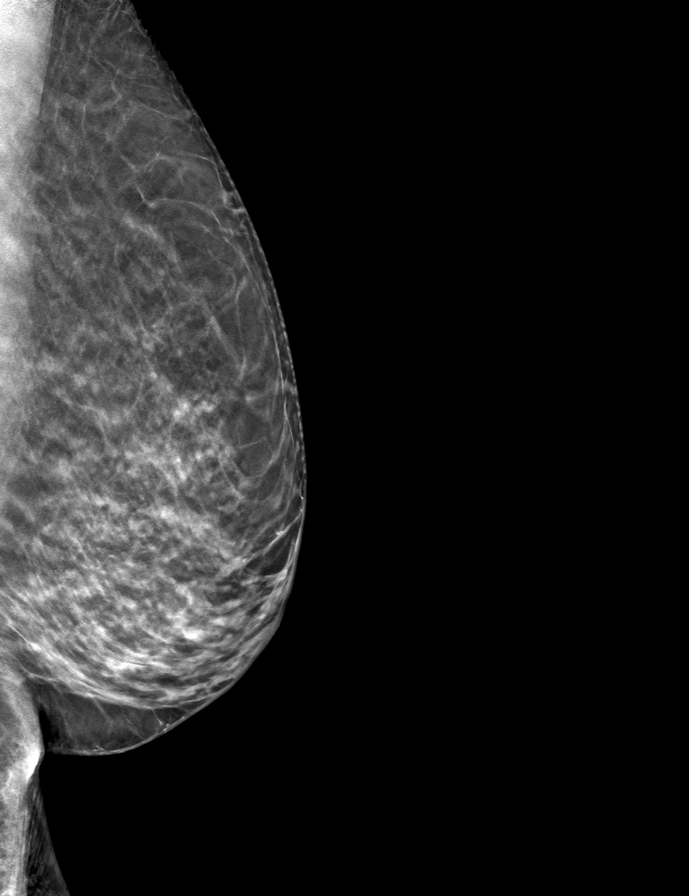

[9 of 24 positions shown; findings below may reference images not displayed]

ACR Breast Density Category c: The breast tissue is heterogeneously
dense, which may obscure small masses.
FINDINGS: Full field cc and mL tomosynthesis as well as spot compression cc
tomosynthesis views of the left breast were performed for a
questioned asymmetry seen only on the cc view in the medial left
breast. On the additional imaging the asymmetry effaces. There is
mass or distortion. There are no new findings in the left breast.
IMPRESSION: No mammographic evidence of malignancy in the left breast.

RECOMMENDATION:
Screening mammogram in one year.(Code:[id number])

I have discussed the findings and recommendations with the patient.
If applicable, a reminder letter will be sent to the patient
regarding the next appointment.

BI-RADS CATEGORY  1: Negative.

## 2023-04-20 ENCOUNTER — Other Ambulatory Visit: Payer: Self-pay | Admitting: Family Medicine

## 2023-04-20 DIAGNOSIS — Z1231 Encounter for screening mammogram for malignant neoplasm of breast: Secondary | ICD-10-CM

## 2023-04-21 ENCOUNTER — Ambulatory Visit
Admission: RE | Admit: 2023-04-21 | Discharge: 2023-04-21 | Disposition: A | Discharge status: Home / Self Care | Payer: Commercial Managed Care - PPO | Source: Ambulatory Visit | Attending: Family Medicine | Admitting: Family Medicine

## 2023-04-21 DIAGNOSIS — Z1231 Encounter for screening mammogram for malignant neoplasm of breast: Secondary | ICD-10-CM

## 2023-05-14 DIAGNOSIS — E1169 Type 2 diabetes mellitus with other specified complication: Secondary | ICD-10-CM | POA: Diagnosis not present

## 2023-05-14 DIAGNOSIS — Z0000 Encounter for general adult medical examination without abnormal findings: Secondary | ICD-10-CM | POA: Diagnosis not present

## 2023-05-14 DIAGNOSIS — I10 Essential (primary) hypertension: Secondary | ICD-10-CM | POA: Diagnosis not present

## 2023-05-14 DIAGNOSIS — E6609 Other obesity due to excess calories: Secondary | ICD-10-CM | POA: Diagnosis not present

## 2023-05-14 DIAGNOSIS — E785 Hyperlipidemia, unspecified: Secondary | ICD-10-CM | POA: Diagnosis not present

## 2023-06-16 ENCOUNTER — Other Ambulatory Visit (HOSPITAL_COMMUNITY): Payer: Self-pay

## 2023-06-16 MED ORDER — ROSUVASTATIN CALCIUM 5 MG PO TABS
5.0000 mg | ORAL_TABLET | Freq: Every day | ORAL | 1 refills | Status: DC
  Filled 2023-06-16: qty 90, 90d supply, fill #0
  Filled 2023-09-11: qty 90, 90d supply, fill #1

## 2023-06-16 MED ORDER — AMLODIPINE BESYLATE 10 MG PO TABS
10.0000 mg | ORAL_TABLET | Freq: Every day | ORAL | 1 refills | Status: DC
  Filled 2023-06-16: qty 90, 90d supply, fill #0
  Filled 2023-09-11: qty 90, 90d supply, fill #1

## 2023-09-11 ENCOUNTER — Other Ambulatory Visit (HOSPITAL_COMMUNITY): Payer: Self-pay

## 2023-10-14 DIAGNOSIS — H524 Presbyopia: Secondary | ICD-10-CM | POA: Diagnosis not present

## 2023-10-14 DIAGNOSIS — H5213 Myopia, bilateral: Secondary | ICD-10-CM | POA: Diagnosis not present

## 2023-12-07 ENCOUNTER — Other Ambulatory Visit (HOSPITAL_COMMUNITY): Payer: Self-pay

## 2023-12-10 ENCOUNTER — Other Ambulatory Visit (HOSPITAL_COMMUNITY): Payer: Self-pay

## 2023-12-10 MED ORDER — AMLODIPINE BESYLATE 10 MG PO TABS
10.0000 mg | ORAL_TABLET | Freq: Every day | ORAL | 1 refills | Status: AC
  Filled 2023-12-10: qty 90, 90d supply, fill #0
  Filled 2024-04-04: qty 90, 90d supply, fill #1

## 2023-12-10 MED ORDER — ROSUVASTATIN CALCIUM 5 MG PO TABS
5.0000 mg | ORAL_TABLET | Freq: Every day | ORAL | 1 refills | Status: AC
  Filled 2023-12-10: qty 90, 90d supply, fill #0
  Filled 2024-04-04: qty 90, 90d supply, fill #1

## 2023-12-30 ENCOUNTER — Other Ambulatory Visit (HOSPITAL_COMMUNITY): Payer: Self-pay

## 2023-12-30 MED ORDER — FLUZONE 0.5 ML IM SUSY
0.5000 mL | PREFILLED_SYRINGE | Freq: Once | INTRAMUSCULAR | 0 refills | Status: AC
  Filled 2023-12-30: qty 0.5, 1d supply, fill #0

## 2024-04-06 ENCOUNTER — Other Ambulatory Visit (HOSPITAL_COMMUNITY): Payer: Self-pay

## 2024-04-11 DIAGNOSIS — Z1231 Encounter for screening mammogram for malignant neoplasm of breast: Secondary | ICD-10-CM

## 2024-04-28 ENCOUNTER — Ambulatory Visit
Admission: RE | Admit: 2024-04-28 | Discharge: 2024-04-28 | Disposition: A | Discharge status: Home / Self Care | Source: Ambulatory Visit | Attending: Family Medicine | Admitting: Family Medicine

## 2024-04-28 DIAGNOSIS — Z1231 Encounter for screening mammogram for malignant neoplasm of breast: Secondary | ICD-10-CM
